# Patient Record
Sex: Male | Born: 1970 | Race: White | Hispanic: No | Marital: Single | Smoking: Never smoker
Health system: Southern US, Community
[De-identification: ages and names within clinical notes are randomized; demographics above are authoritative.]

## PROBLEM LIST (undated history)

## (undated) DIAGNOSIS — K469 Unspecified abdominal hernia without obstruction or gangrene: Secondary | ICD-10-CM

## (undated) DIAGNOSIS — M549 Dorsalgia, unspecified: Secondary | ICD-10-CM

## (undated) DIAGNOSIS — G8929 Other chronic pain: Secondary | ICD-10-CM

---

## 2000-01-24 ENCOUNTER — Encounter: Payer: Self-pay | Admitting: Emergency Medicine

## 2000-01-24 ENCOUNTER — Encounter: Payer: Self-pay | Admitting: General Surgery

## 2000-01-24 ENCOUNTER — Emergency Department (HOSPITAL_COMMUNITY): Admission: EM | Admit: 2000-01-24 | Discharge: 2000-01-24 | Payer: Self-pay | Admitting: Emergency Medicine

## 2000-01-29 ENCOUNTER — Emergency Department (HOSPITAL_COMMUNITY): Admission: EM | Admit: 2000-01-29 | Discharge: 2000-01-29 | Payer: Self-pay | Admitting: Emergency Medicine

## 2000-08-04 ENCOUNTER — Emergency Department (HOSPITAL_COMMUNITY): Admission: EM | Admit: 2000-08-04 | Discharge: 2000-08-04 | Payer: Self-pay | Admitting: Emergency Medicine

## 2001-04-04 ENCOUNTER — Emergency Department (HOSPITAL_COMMUNITY): Admission: EM | Admit: 2001-04-04 | Discharge: 2001-04-04 | Payer: Self-pay | Admitting: Emergency Medicine

## 2001-04-04 ENCOUNTER — Encounter: Payer: Self-pay | Admitting: Emergency Medicine

## 2001-04-21 ENCOUNTER — Ambulatory Visit (HOSPITAL_BASED_OUTPATIENT_CLINIC_OR_DEPARTMENT_OTHER): Admission: RE | Admit: 2001-04-21 | Discharge: 2001-04-21 | Payer: Self-pay | Admitting: Orthopedic Surgery

## 2002-02-05 ENCOUNTER — Emergency Department (HOSPITAL_COMMUNITY): Admission: EM | Admit: 2002-02-05 | Discharge: 2002-02-05 | Payer: Self-pay | Admitting: Emergency Medicine

## 2002-05-22 ENCOUNTER — Emergency Department (HOSPITAL_COMMUNITY): Admission: EM | Admit: 2002-05-22 | Discharge: 2002-05-23 | Payer: Self-pay | Admitting: Emergency Medicine

## 2002-12-29 ENCOUNTER — Emergency Department (HOSPITAL_COMMUNITY): Admission: EM | Admit: 2002-12-29 | Discharge: 2002-12-29 | Payer: Self-pay | Admitting: Emergency Medicine

## 2002-12-29 ENCOUNTER — Encounter: Payer: Self-pay | Admitting: Emergency Medicine

## 2003-01-17 ENCOUNTER — Encounter: Payer: Self-pay | Admitting: Emergency Medicine

## 2003-01-17 ENCOUNTER — Emergency Department (HOSPITAL_COMMUNITY): Admission: EM | Admit: 2003-01-17 | Discharge: 2003-01-17 | Payer: Self-pay | Admitting: Emergency Medicine

## 2003-02-02 ENCOUNTER — Ambulatory Visit (HOSPITAL_BASED_OUTPATIENT_CLINIC_OR_DEPARTMENT_OTHER): Admission: RE | Admit: 2003-02-02 | Discharge: 2003-02-02 | Payer: Self-pay | Admitting: Surgery

## 2003-08-24 ENCOUNTER — Emergency Department (HOSPITAL_COMMUNITY): Admission: EM | Admit: 2003-08-24 | Discharge: 2003-08-24 | Payer: Self-pay | Admitting: Emergency Medicine

## 2003-09-28 ENCOUNTER — Emergency Department (HOSPITAL_COMMUNITY): Admission: EM | Admit: 2003-09-28 | Discharge: 2003-09-28 | Payer: Self-pay | Admitting: Family Medicine

## 2004-09-26 ENCOUNTER — Emergency Department (HOSPITAL_COMMUNITY): Admission: EM | Admit: 2004-09-26 | Discharge: 2004-09-26 | Payer: Self-pay | Admitting: Emergency Medicine

## 2005-02-18 ENCOUNTER — Emergency Department (HOSPITAL_COMMUNITY): Admission: EM | Admit: 2005-02-18 | Discharge: 2005-02-19 | Payer: Self-pay | Admitting: Emergency Medicine

## 2007-05-10 ENCOUNTER — Emergency Department (HOSPITAL_COMMUNITY): Admission: EM | Admit: 2007-05-10 | Discharge: 2007-05-10 | Payer: Self-pay | Admitting: Emergency Medicine

## 2007-05-14 ENCOUNTER — Encounter: Admission: RE | Admit: 2007-05-14 | Discharge: 2007-07-07 | Payer: Self-pay | Admitting: Specialist

## 2007-06-09 ENCOUNTER — Ambulatory Visit (HOSPITAL_COMMUNITY): Admission: RE | Admit: 2007-06-09 | Discharge: 2007-06-09 | Payer: Self-pay | Admitting: Specialist

## 2007-07-12 ENCOUNTER — Encounter: Admission: RE | Admit: 2007-07-12 | Discharge: 2007-07-12 | Payer: Self-pay | Admitting: Orthopedic Surgery

## 2007-08-09 ENCOUNTER — Encounter: Admission: RE | Admit: 2007-08-09 | Discharge: 2007-08-09 | Payer: Self-pay | Admitting: Orthopedic Surgery

## 2007-08-13 ENCOUNTER — Encounter: Admission: RE | Admit: 2007-08-13 | Discharge: 2007-11-11 | Payer: Self-pay | Admitting: Orthopedic Surgery

## 2007-09-27 ENCOUNTER — Ambulatory Visit (HOSPITAL_COMMUNITY): Admission: RE | Admit: 2007-09-27 | Discharge: 2007-09-27 | Payer: Self-pay | Admitting: Orthopedic Surgery

## 2007-12-10 ENCOUNTER — Ambulatory Visit: Payer: Self-pay | Admitting: Physical Medicine & Rehabilitation

## 2008-02-18 ENCOUNTER — Ambulatory Visit: Payer: Self-pay | Admitting: Physical Medicine & Rehabilitation

## 2008-06-09 ENCOUNTER — Ambulatory Visit: Payer: Self-pay | Admitting: Physical Medicine & Rehabilitation

## 2009-12-20 ENCOUNTER — Ambulatory Visit: Payer: Self-pay | Admitting: Internal Medicine

## 2009-12-20 DIAGNOSIS — K219 Gastro-esophageal reflux disease without esophagitis: Secondary | ICD-10-CM

## 2009-12-20 DIAGNOSIS — R519 Headache, unspecified: Secondary | ICD-10-CM | POA: Insufficient documentation

## 2009-12-20 DIAGNOSIS — R51 Headache: Secondary | ICD-10-CM

## 2009-12-20 DIAGNOSIS — F411 Generalized anxiety disorder: Secondary | ICD-10-CM | POA: Insufficient documentation

## 2010-06-25 ENCOUNTER — Emergency Department (HOSPITAL_COMMUNITY): Admission: EM | Admit: 2010-06-25 | Discharge: 2010-06-25 | Payer: Self-pay | Admitting: Family Medicine
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
## 2010-10-08 ENCOUNTER — Ambulatory Visit (INDEPENDENT_AMBULATORY_CARE_PROVIDER_SITE_OTHER): Payer: Medicare Other

## 2010-10-08 ENCOUNTER — Inpatient Hospital Stay (INDEPENDENT_AMBULATORY_CARE_PROVIDER_SITE_OTHER)
Admission: RE | Admit: 2010-10-08 | Discharge: 2010-10-08 | Disposition: A | Discharge status: Home / Self Care | Payer: Medicare Other | Source: Ambulatory Visit

## 2010-10-08 DIAGNOSIS — M25519 Pain in unspecified shoulder: Secondary | ICD-10-CM

## 2010-10-08 DIAGNOSIS — H6690 Otitis media, unspecified, unspecified ear: Secondary | ICD-10-CM

## 2010-10-08 DIAGNOSIS — J019 Acute sinusitis, unspecified: Secondary | ICD-10-CM

## 2010-10-08 DIAGNOSIS — R071 Chest pain on breathing: Secondary | ICD-10-CM
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
## 2010-12-24 NOTE — Procedures (Signed)
NAME:  Eduardo Johnson, Eduardo Johnson NO.:  [id number]   MEDICAL RECORD NO.:  [id number]           PATIENT TYPE:   LOCATION:                                 FACILITY:   PHYSICIAN:  Erick Colace, M.D.DATE OF BIRTH:  06/03/1971   DATE OF PROCEDURE:  12/10/2007  DATE OF DISCHARGE:                               OPERATIVE REPORT   INDICATION:  Myofascial pain syndrome thoracolumbar area.   Area is marked and prepped with Betadine 3 cm lateral spinous processes  of bilateral L1 and L3 and left T9.  Betadine prep with alcohol prep  followed by insertion of a 27 gauge 5/8 inch needle, 1 mL of 1%  lidocaine into each spot after negative drawback for blood.  The patient  tolerated the procedure well.  Post injection instructions given.      Erick Colace, M.D.  Electronically Signed     AEK/MEDQ  D:  12/10/2007 10:37:34  T:  12/10/2007 10:46:32  Job:  [id number]   cc:   [name], MD  Fax: [phone]

## 2010-12-27 NOTE — Op Note (Signed)
NAME:  Eduardo Johnson, Eduardo Johnson                             ACCOUNT NO.:  [id number]   MEDICAL RECORD NO.:  [id number]                   PATIENT TYPE:  AMB   LOCATION:  [facility]                                  FACILITY:  [facility]   PHYSICIAN:  Sandria Bales. Ezzard Standing, M.D.               DATE OF BIRTH:  May 01, 1971   DATE OF PROCEDURE:  02/02/2003  DATE OF DISCHARGE:                                 OPERATIVE REPORT   PREOPERATIVE DIAGNOSIS:  Left inguinal hernia.   POSTOPERATIVE DIAGNOSIS:  Large indirect left inguinal hernia.   PROCEDURE:  Open repair of left inguinal hernia with mesh.   SURGEON:  Sandria Bales. Ezzard Standing, M.D.   ANESTHESIA:  LMA with approximately 25 mL 0.25% Marcaine plain.   COMPLICATIONS:  None.   INDICATION FOR PROCEDURE:  [name] is a 40 year old white male who has a  symptomatic left inguinal hernia and now comes for hernia repair.  I  discussed with him the options and indications for surgery.  I also  discussed the complications, including but not limited to bleeding,  infection, nerve injury, and recurrent hernia.   The patient was taken to the operating room, where he was given 400 mg of  Cipro as an IV antibiotic. His lower abdomen was shaved, prepped with  Betadine solution, and sterilely draped.  He underwent an LMA anesthesia  supervised by [name]. [name], M.D.  A left inguinal area incision was  made with sharp dissection and carried down to the external oblique fascia.  The external oblique fascia was opened and the cord structures identified  and encircled with a Penrose drain.  I used 0.25% Marcaine to infiltrate the  skin, subcutaneous fat, subfascial spaces.  I used a total of about 25 mL  during the procedure.  Along the anterior medial surface of the cord  structures was a large indirect inguinal hernia, which went down to the  testicle.  I opened this sac, dissected the sac back to the internal ring,  and ligated the sac with a 0 chromic suture.  The distal sac I  just left  intact, though I did fillet it some.   I then used a piece of Atrium mesh, which I cut in the shape of the inguinal  floor, and I sewed the Atrium mesh medial to the deep pubic tubercle,  inferiorly to the shelving edge of the inguinal ligament, superiorly to the  transversalis fascia.  I cut a keyhole for the cord structures to go through  and to recreate an internal ring and sewed the mesh behind the keyhole.  The  mesh lay flat, covered the inguinal floor, and recreated the internal ring.  The cord structures were then returned to their normal location.  The wound  was then irrigated.  I used 0 Novofil as the suture in an interrupted  fashion to place the mesh.  I then  closed the external oblique fascia with  interrupted 3-0 Vicryl sutures.  I closed the subcutaneous tissues with  interrupted 3-0 Vicryl sutures, irrigated the wound again, closed the skin  with a 5-0 Monocryl suture, and painted the wound with tincture of Benzoin  and steri-stripped it.   The patient tolerated the procedure well, was transported to the recovery  room in good condition.  He will be discharged home today and see me back in  two weeks.  Call for interval problems.  He will be given Vicodin for pain  at home.                                               Sandria Bales. Ezzard Standing, M.D.    DHN/MEDQ  D:  02/02/2003  T:  02/03/2003  Job:  [id number]

## 2011-10-30 ENCOUNTER — Encounter (HOSPITAL_COMMUNITY): Payer: Self-pay | Admitting: *Deleted

## 2011-10-30 ENCOUNTER — Emergency Department (HOSPITAL_COMMUNITY)
Admission: EM | Admit: 2011-10-30 | Discharge: 2011-10-31 | Disposition: A | Discharge status: Home / Self Care | Payer: Medicare Other | Attending: Emergency Medicine | Admitting: Emergency Medicine

## 2011-10-30 DIAGNOSIS — M549 Dorsalgia, unspecified: Secondary | ICD-10-CM | POA: Insufficient documentation

## 2011-10-30 DIAGNOSIS — K5732 Diverticulitis of large intestine without perforation or abscess without bleeding: Secondary | ICD-10-CM | POA: Insufficient documentation

## 2011-10-30 DIAGNOSIS — K5792 Diverticulitis of intestine, part unspecified, without perforation or abscess without bleeding: Secondary | ICD-10-CM

## 2011-10-30 DIAGNOSIS — G8929 Other chronic pain: Secondary | ICD-10-CM | POA: Insufficient documentation

## 2011-10-30 HISTORY — DX: Dorsalgia, unspecified: M54.9

## 2011-10-30 HISTORY — DX: Other chronic pain: G89.29

## 2011-10-30 HISTORY — DX: Unspecified abdominal hernia without obstruction or gangrene: K46.9

## 2011-10-30 NOTE — ED Notes (Signed)
Pt states that "he wasn't doing nothing" yesterday when the left side of his abdomen began to hurt.  Pt states that the pain originates in his left groin area and radiates upwards.  Pt states that he has a hx of hernia here.  Pt reports bowel movements per his norm. Pt denies N/V/D.

## 2011-10-31 ENCOUNTER — Emergency Department (HOSPITAL_COMMUNITY): Payer: Medicare Other

## 2011-10-31 LAB — URINALYSIS, ROUTINE W REFLEX MICROSCOPIC
Glucose, UA: NEGATIVE mg/dL
Hgb urine dipstick: NEGATIVE
Nitrite: NEGATIVE
Protein, ur: NEGATIVE mg/dL
Urobilinogen, UA: 0.2 mg/dL (ref 0.0–1.0)
pH: 5.5 (ref 5.0–8.0)

## 2011-10-31 LAB — DIFFERENTIAL
Basophils Absolute: 0.1 10*3/uL (ref 0.0–0.1)
Basophils Relative: 1 % (ref 0–1)
Eosinophils Absolute: 0.2 10*3/uL (ref 0.0–0.7)
Eosinophils Relative: 1 % (ref 0–5)
Lymphocytes Relative: 21 % (ref 12–46)
Lymphs Abs: 2.8 10*3/uL (ref 0.7–4.0)
Monocytes Relative: 10 % (ref 3–12)
Neutro Abs: 8.7 10*3/uL — ABNORMAL HIGH (ref 1.7–7.7)

## 2011-10-31 LAB — LIPASE, BLOOD: Lipase: 36 U/L (ref 11–59)

## 2011-10-31 LAB — CBC
MCH: 29.3 pg (ref 26.0–34.0)
RBC: 4.88 MIL/uL (ref 4.22–5.81)
RDW: 13 % (ref 11.5–15.5)
WBC: 12.9 10*3/uL — ABNORMAL HIGH (ref 4.0–10.5)

## 2011-10-31 LAB — COMPREHENSIVE METABOLIC PANEL
BUN: 12 mg/dL (ref 6–23)
Creatinine, Ser: 0.95 mg/dL (ref 0.50–1.35)
GFR calc Af Amer: >90 mL/min (ref >90)
GFR calc non Af Amer: >90 mL/min (ref >90)
Potassium: 3.4 mEq/L — ABNORMAL LOW (ref 3.5–5.1)
Total Bilirubin: 0.5 mg/dL (ref 0.3–1.2)

## 2011-10-31 MED ORDER — OXYCODONE-ACETAMINOPHEN 5-325 MG PO TABS: 1.0000 | ORAL_TABLET | Freq: Four times a day (QID) | ORAL | Status: AC | PRN

## 2011-10-31 MED ORDER — ONDANSETRON HCL 4 MG/2ML IJ SOLN
4.0000 mg | Freq: Once | INTRAMUSCULAR | Status: AC
  Administered 2011-10-31: 4 mg via INTRAVENOUS
  Filled 2011-10-31: qty 2

## 2011-10-31 MED ORDER — CIPROFLOXACIN IN D5W 400 MG/200ML IV SOLN
400.0000 mg | Freq: Once | INTRAVENOUS | Status: AC
  Administered 2011-10-31: 400 mg via INTRAVENOUS
  Filled 2011-10-31: qty 200

## 2011-10-31 MED ORDER — METRONIDAZOLE IN NACL 5-0.79 MG/ML-% IV SOLN
500.0000 mg | Freq: Once | INTRAVENOUS | Status: AC
  Administered 2011-10-31: 500 mg via INTRAVENOUS
  Filled 2011-10-31: qty 100

## 2011-10-31 MED ORDER — METRONIDAZOLE 500 MG PO TABS: 500.0000 mg | ORAL_TABLET | Freq: Two times a day (BID) | ORAL | Status: AC

## 2011-10-31 MED ORDER — IOHEXOL 300 MG/ML  SOLN
100.0000 mL | Freq: Once | INTRAMUSCULAR | Status: AC | PRN
  Administered 2011-10-31: 100 mL via INTRAVENOUS

## 2011-10-31 MED ORDER — CIPROFLOXACIN HCL 500 MG PO TABS: 500.0000 mg | ORAL_TABLET | Freq: Two times a day (BID) | ORAL | Status: AC

## 2011-10-31 MED ORDER — HYDROMORPHONE HCL PF 1 MG/ML IJ SOLN
1.0000 mg | Freq: Once | INTRAMUSCULAR | Status: AC
  Administered 2011-10-31: 1 mg via INTRAVENOUS
  Filled 2011-10-31: qty 1

## 2011-10-31 MED ORDER — SODIUM CHLORIDE 0.9 % IV BOLUS (SEPSIS)
1000.0000 mL | Freq: Once | INTRAVENOUS | Status: AC
  Administered 2011-10-31: 1000 mL via INTRAVENOUS

## 2011-10-31 MED ORDER — ONDANSETRON HCL 4 MG PO TABS
4.0000 mg | ORAL_TABLET | Freq: Four times a day (QID) | ORAL | Status: AC
Start: 2011-10-31 — End: 2011-11-07

## 2011-10-31 NOTE — ED Notes (Signed)
Patient c/o progressive left lower ABD pain radiating to the left groin since yesterday; reports worsening with BM. Denies n/v/d/dysuria. Patient reports hx of left inguinal hernia.

## 2011-10-31 NOTE — ED Notes (Signed)
MD at bedside.  Dr. King at bedside 
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
## 2011-10-31 NOTE — Discharge Instructions (Signed)
Diverticulitis A diverticulum is a small pouch or sac on the colon. Diverticulosis is the presence of these diverticula on the colon. Diverticulitis is the irritation (inflammation) or infection of diverticula. CAUSES  The colon and its diverticula contain bacteria. If food particles block the tiny opening to a diverticulum, the bacteria inside can grow and cause an increase in pressure. This leads to infection and inflammation and is called diverticulitis. SYMPTOMS   Abdominal pain and tenderness. Usually, the pain is located on the left side of your abdomen. However, it could be located elsewhere.   Fever.   Bloating.   Feeling sick to your stomach (nausea).   Throwing up (vomiting).   Abnormal stools.  DIAGNOSIS  Your caregiver will take a history and perform a physical exam. Since many things can cause abdominal pain, other tests may be necessary. Tests may include:  Blood tests.   Urine tests.   X-ray of the abdomen.   CT scan of the abdomen.  Sometimes, surgery is needed to determine if diverticulitis or other conditions are causing your symptoms. TREATMENT  Most of the time, you can be treated without surgery. Treatment includes:  Resting the bowels by only having liquids for a few days. As you improve, you will need to eat a low-fiber diet.   Intravenous (IV) fluids if you are losing body fluids (dehydrated).   Antibiotic medicines that treat infections may be given.   Pain and nausea medicine, if needed.   Surgery if the inflamed diverticulum has burst.  HOME CARE INSTRUCTIONS   Try a clear liquid diet (broth, tea, or water for as long as directed by your caregiver). You may then gradually begin a low-fiber diet as tolerated. A low-fiber diet is a diet with less than 10 grams of fiber. Choose the foods below to reduce fiber in the diet:   White breads, cereals, rice, and pasta.   Cooked fruits and vegetables or soft fresh fruits and vegetables without the skin.     Ground or well-cooked tender beef, ham, veal, lamb, pork, or poultry.   Eggs and seafood.   After your diverticulitis symptoms have improved, your caregiver may put you on a high-fiber diet. A high-fiber diet includes 14 grams of fiber for every 1000 calories consumed. For a standard 2000 calorie diet, you would need 28 grams of fiber. Follow these diet guidelines to help you increase the fiber in your diet. It is important to slowly increase the amount fiber in your diet to avoid gas, constipation, and bloating.   Choose whole-grain breads, cereals, pasta, and brown rice.   Choose fresh fruits and vegetables with the skin on. Do not overcook vegetables because the more vegetables are cooked, the more fiber is lost.   Choose more nuts, seeds, legumes, dried peas, beans, and lentils.   Look for food products that have greater than 3 grams of fiber per serving on the Nutrition Facts label.   Take all medicine as directed by your caregiver.   If your caregiver has given you a follow-up appointment, it is very important that you go. Not going could result in lasting (chronic) or permanent injury, pain, and disability. If there is any problem keeping the appointment, call to reschedule.  SEEK MEDICAL CARE IF:   Your pain does not improve.   You have a hard time advancing your diet beyond clear liquids.   Your bowel movements do not return to normal.  SEEK IMMEDIATE MEDICAL CARE IF:   Your pain becomes   worse.   You have an oral temperature above 102 F (38.9 C), not controlled by medicine.   You have repeated vomiting.   You have bloody or black, tarry stools.   Symptoms that brought you to your caregiver become worse or are not getting better.  MAKE SURE YOU:   Understand these instructions.   Will watch your condition.   Will get help right away if you are not doing well or get worse.  Document Released: 05/07/2005 Document Revised: 07/17/2011 Document Reviewed:  09/02/2010 [employer] Patient Information 2012 [employer]. 

## 2012-01-15 ENCOUNTER — Emergency Department (HOSPITAL_COMMUNITY)
Admission: EM | Admit: 2012-01-15 | Discharge: 2012-01-15 | Disposition: A | Discharge status: Home / Self Care | Payer: Medicare Other | Attending: Emergency Medicine | Admitting: Emergency Medicine

## 2012-01-15 ENCOUNTER — Encounter (HOSPITAL_COMMUNITY): Payer: Self-pay | Admitting: Emergency Medicine

## 2012-01-15 DIAGNOSIS — L02419 Cutaneous abscess of limb, unspecified: Secondary | ICD-10-CM | POA: Insufficient documentation

## 2012-01-15 DIAGNOSIS — L03115 Cellulitis of right lower limb: Secondary | ICD-10-CM

## 2012-01-15 MED ORDER — CLINDAMYCIN HCL 150 MG PO CAPS: 150.0000 mg | ORAL_CAPSULE | Freq: Four times a day (QID) | ORAL | Status: AC
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
## 2012-01-15 NOTE — Discharge Instructions (Signed)
You may continue apply neosporin to rash.  Take antibiotic as prescribed.  Return if you notice increase redness, pain, having fever or if you have any other concerns.  You should see improvement within 2 days.   Cellulitis Cellulitis is an infection of the skin and the tissue beneath it. The area is typically red and tender. It is caused by germs (bacteria) (usually staph or strep) that enter the body through cuts or sores. Cellulitis most commonly occurs in the arms or lower legs.  HOME CARE INSTRUCTIONS   If you are given a prescription for medications which kill germs (antibiotics), take as directed until finished.   If the infection is on the arm or leg, keep the limb elevated as able.   Use a warm cloth several times per day to relieve pain and encourage healing.   See your caregiver for recheck of the infected site as directed if problems arise.   Only take over-the-counter or prescription medicines for pain, discomfort, or fever as directed by your caregiver.  SEEK MEDICAL CARE IF:   The area of redness (inflammation) is spreading, there are red streaks coming from the infected site, or if a part of the infection begins to turn dark in color.   The joint or bone underneath the infected skin becomes painful after the skin has healed.   The infection returns in the same or another area after it seems to have gone away.   A boil or bump swells up. This may be an abscess.   New, unexplained problems such as pain or fever develop.  SEEK IMMEDIATE MEDICAL CARE IF:   You have a fever.   You or your child feels drowsy or lethargic.   There is vomiting, diarrhea, or lasting discomfort or feeling ill (malaise) with muscle aches and pains.  MAKE SURE YOU:   Understand these instructions.   Will watch your condition.   Will get help right away if you are not doing well or get worse.  Document Released: 05/07/2005 Document Revised: 07/17/2011 Document Reviewed:  03/15/2008 [facility] [place] Patient Information 2012 [place], [place].

## 2012-01-15 NOTE — ED Notes (Signed)
Pt states that a couple of days ago he noticed a red spot on the back of leg. States that he thinks that maybe its a spider bite. C/o pain, redness to area. States that he has used neosporin to apply to the area with no relief.

## 2012-01-16 NOTE — ED Provider Notes (Signed)
Medical screening examination/treatment/procedure(s) were performed by non-physician practitioner and as supervising physician I was immediately available for consultation/collaboration.  Zilla Shartzer, MD 01/16/12 1522 

## 2012-06-25 ENCOUNTER — Encounter (HOSPITAL_COMMUNITY): Payer: Self-pay

## 2012-06-25 DIAGNOSIS — Y939 Activity, unspecified: Secondary | ICD-10-CM | POA: Insufficient documentation

## 2012-06-25 DIAGNOSIS — Z8719 Personal history of other diseases of the digestive system: Secondary | ICD-10-CM | POA: Insufficient documentation

## 2012-06-25 DIAGNOSIS — L02419 Cutaneous abscess of limb, unspecified: Secondary | ICD-10-CM | POA: Insufficient documentation

## 2012-06-25 DIAGNOSIS — Y929 Unspecified place or not applicable: Secondary | ICD-10-CM | POA: Insufficient documentation

## 2012-06-25 DIAGNOSIS — G8929 Other chronic pain: Secondary | ICD-10-CM | POA: Insufficient documentation

## 2012-06-25 DIAGNOSIS — Z79899 Other long term (current) drug therapy: Secondary | ICD-10-CM | POA: Insufficient documentation

## 2012-06-25 DIAGNOSIS — M549 Dorsalgia, unspecified: Secondary | ICD-10-CM | POA: Insufficient documentation

## 2012-06-25 NOTE — ED Notes (Signed)
Pt complains of bite to left lower leg, swelling noted, sts noticed this am

## 2012-06-26 ENCOUNTER — Emergency Department (HOSPITAL_COMMUNITY)
Admission: EM | Admit: 2012-06-26 | Discharge: 2012-06-26 | Disposition: A | Discharge status: Home / Self Care | Payer: Medicare Other | Attending: Emergency Medicine | Admitting: Emergency Medicine

## 2012-06-26 DIAGNOSIS — L0390 Cellulitis, unspecified: Secondary | ICD-10-CM

## 2012-06-26 MED ORDER — DOXYCYCLINE HYCLATE 100 MG PO CAPS: 100.0000 mg | ORAL_CAPSULE | Freq: Two times a day (BID) | ORAL | Status: DC
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
## 2013-01-09 ENCOUNTER — Emergency Department (HOSPITAL_COMMUNITY)
Admission: EM | Admit: 2013-01-09 | Discharge: 2013-01-09 | Disposition: A | Discharge status: Home / Self Care | Payer: Medicare Other | Attending: Emergency Medicine | Admitting: Emergency Medicine

## 2013-01-09 ENCOUNTER — Encounter (HOSPITAL_COMMUNITY): Payer: Self-pay | Admitting: Emergency Medicine

## 2013-01-09 DIAGNOSIS — G8929 Other chronic pain: Secondary | ICD-10-CM | POA: Insufficient documentation

## 2013-01-09 DIAGNOSIS — Z8719 Personal history of other diseases of the digestive system: Secondary | ICD-10-CM | POA: Insufficient documentation

## 2013-01-09 DIAGNOSIS — M549 Dorsalgia, unspecified: Secondary | ICD-10-CM

## 2013-01-09 MED ORDER — HYDROMORPHONE HCL PF 1 MG/ML IJ SOLN
1.0000 mg | Freq: Once | INTRAMUSCULAR | Status: AC
  Administered 2013-01-09: 1 mg via INTRAMUSCULAR
  Filled 2013-01-09: qty 1

## 2013-01-09 MED ORDER — HYDROCODONE-ACETAMINOPHEN 5-325 MG PO TABS: 1.0000 | ORAL_TABLET | ORAL | Status: DC | PRN

## 2013-01-09 MED ORDER — CYCLOBENZAPRINE HCL 10 MG PO TABS
5.0000 mg | ORAL_TABLET | Freq: Once | ORAL | Status: AC
  Administered 2013-01-09: 5 mg via ORAL
  Filled 2013-01-09: qty 1
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
## 2013-05-05 ENCOUNTER — Ambulatory Visit (INDEPENDENT_AMBULATORY_CARE_PROVIDER_SITE_OTHER): Payer: Medicare Other | Admitting: Emergency Medicine

## 2013-05-05 ENCOUNTER — Encounter: Payer: Self-pay | Admitting: Emergency Medicine

## 2013-05-05 VITALS — BP 140/90 | HR 72 | Resp 16 | Ht 68.0 in | Wt 198.0 lb

## 2013-05-05 DIAGNOSIS — K219 Gastro-esophageal reflux disease without esophagitis: Secondary | ICD-10-CM

## 2013-05-05 DIAGNOSIS — R079 Chest pain, unspecified: Secondary | ICD-10-CM

## 2013-05-05 MED ORDER — SUCRALFATE 1 G PO TABS: ORAL_TABLET | ORAL | Status: DC

## 2013-05-05 MED ORDER — LANSOPRAZOLE 30 MG PO CPDR: 30.0000 mg | DELAYED_RELEASE_CAPSULE | Freq: Every day | ORAL | Status: DC

## 2013-05-05 NOTE — Progress Notes (Deleted)
Pt took 30 cc's of GI cocktail at 5:45pm. Cassie Freer
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
## 2013-05-24 ENCOUNTER — Ambulatory Visit: Payer: Medicare Other | Admitting: Cardiovascular Disease

## 2013-05-25 ENCOUNTER — Encounter: Payer: Self-pay | Admitting: Cardiovascular Disease

## 2013-05-27 ENCOUNTER — Encounter (HOSPITAL_COMMUNITY): Payer: Self-pay | Admitting: Emergency Medicine

## 2013-05-27 ENCOUNTER — Emergency Department (HOSPITAL_COMMUNITY)
Admission: EM | Admit: 2013-05-27 | Discharge: 2013-05-27 | Disposition: A | Discharge status: Home / Self Care | Payer: Medicare Other | Attending: Emergency Medicine | Admitting: Emergency Medicine

## 2013-05-27 DIAGNOSIS — Z79899 Other long term (current) drug therapy: Secondary | ICD-10-CM | POA: Insufficient documentation

## 2013-05-27 DIAGNOSIS — M545 Low back pain, unspecified: Secondary | ICD-10-CM | POA: Insufficient documentation

## 2013-05-27 DIAGNOSIS — R52 Pain, unspecified: Secondary | ICD-10-CM | POA: Insufficient documentation

## 2013-05-27 DIAGNOSIS — Z7982 Long term (current) use of aspirin: Secondary | ICD-10-CM | POA: Insufficient documentation

## 2013-05-27 DIAGNOSIS — G8929 Other chronic pain: Secondary | ICD-10-CM | POA: Insufficient documentation

## 2013-05-27 DIAGNOSIS — R209 Unspecified disturbances of skin sensation: Secondary | ICD-10-CM | POA: Insufficient documentation

## 2013-05-27 DIAGNOSIS — Z880 Allergy status to penicillin: Secondary | ICD-10-CM | POA: Insufficient documentation

## 2013-05-27 DIAGNOSIS — M79609 Pain in unspecified limb: Secondary | ICD-10-CM | POA: Insufficient documentation

## 2013-05-27 DIAGNOSIS — Z8719 Personal history of other diseases of the digestive system: Secondary | ICD-10-CM | POA: Insufficient documentation

## 2013-05-27 MED ORDER — NAPROXEN 500 MG PO TABS: 500.0000 mg | ORAL_TABLET | Freq: Two times a day (BID) | ORAL | Status: DC

## 2013-05-27 MED ORDER — CYCLOBENZAPRINE HCL 10 MG PO TABS: 10.0000 mg | ORAL_TABLET | Freq: Two times a day (BID) | ORAL | Status: DC | PRN

## 2013-05-27 MED ORDER — HYDROCODONE-ACETAMINOPHEN 5-325 MG PO TABS: 1.0000 | ORAL_TABLET | Freq: Three times a day (TID) | ORAL | Status: DC | PRN

## 2013-05-27 MED ORDER — NAPROXEN 500 MG PO TABS
500.0000 mg | ORAL_TABLET | Freq: Once | ORAL | Status: AC
  Administered 2013-05-27: 500 mg via ORAL
  Filled 2013-05-27: qty 1

## 2013-05-27 MED ORDER — KETOROLAC TROMETHAMINE 60 MG/2ML IM SOLN
60.0000 mg | Freq: Once | INTRAMUSCULAR | Status: AC
  Administered 2013-05-27: 60 mg via INTRAMUSCULAR
  Filled 2013-05-27: qty 2
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
## 2013-05-28 NOTE — ED Provider Notes (Signed)
Medical screening examination/treatment/procedure(s) were performed by non-physician practitioner and as supervising physician I was immediately available for consultation/collaboration.  Doug Sou, MD 05/28/13 [phone]

## 2014-03-20 ENCOUNTER — Emergency Department (INDEPENDENT_AMBULATORY_CARE_PROVIDER_SITE_OTHER)
Admission: EM | Admit: 2014-03-20 | Discharge: 2014-03-20 | Disposition: A | Discharge status: Home / Self Care | Payer: Medicare Other | Source: Home / Self Care

## 2014-03-20 ENCOUNTER — Emergency Department (INDEPENDENT_AMBULATORY_CARE_PROVIDER_SITE_OTHER): Payer: Medicare Other

## 2014-03-20 ENCOUNTER — Encounter (HOSPITAL_COMMUNITY): Payer: Self-pay | Admitting: Emergency Medicine

## 2014-03-20 DIAGNOSIS — R143 Flatulence: Secondary | ICD-10-CM

## 2014-03-20 DIAGNOSIS — R142 Eructation: Secondary | ICD-10-CM

## 2014-03-20 DIAGNOSIS — R141 Gas pain: Secondary | ICD-10-CM

## 2014-03-20 DIAGNOSIS — K5909 Other constipation: Secondary | ICD-10-CM

## 2014-03-20 LAB — POCT URINALYSIS DIP (DEVICE)
Bilirubin Urine: NEGATIVE
GLUCOSE, UA: NEGATIVE mg/dL
Hgb urine dipstick: NEGATIVE
KETONES UR: NEGATIVE mg/dL
LEUKOCYTES UA: NEGATIVE
Nitrite: NEGATIVE
PROTEIN: NEGATIVE mg/dL
SPECIFIC GRAVITY, URINE: 1.025 (ref 1.005–1.030)
UROBILINOGEN UA: 0.2 mg/dL (ref 0.0–1.0)
pH: 5 (ref 5.0–8.0)

## 2014-03-20 MED ORDER — POLYETHYLENE GLYCOL 3350 17 GM/SCOOP PO POWD: 17.0000 g | Freq: Every day | ORAL | Status: AC
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
## 2014-03-20 NOTE — ED Notes (Signed)
C/o 2 day duration of pain lower left abdominal area w bloating. Ever since ate out . Minimal relief w OTC liquid antacids ; reportedly having normal BM

## 2014-03-20 NOTE — ED Provider Notes (Signed)
Medical screening examination/treatment/procedure(s) were performed by a resident physician or non-physician practitioner and as the supervising physician I was immediately available for consultation/collaboration.  Shelly Flattenavid Rodman Recupero, MD Family Medicine   Ozella Rocksavid J Manasi Dishon, MD 03/20/14 [phone]

## 2014-03-20 NOTE — Discharge Instructions (Signed)
Bloating Bloating is the feeling of fullness in your belly. You may feel as though your pants are too tight. Often the cause of bloating is overeating, retaining fluids, or having gas in your bowel. It is also caused by swallowing air and eating foods that cause gas. Irritable bowel syndrome is one of the most common causes of bloating. Constipation is also a common cause. Sometimes more serious problems can cause bloating. SYMPTOMS  Usually there is a feeling of fullness, as though your abdomen is bulged out. There may be mild discomfort.  DIAGNOSIS  Usually no particular testing is necessary for most bloating. If the condition persists and seems to become worse, your caregiver may do additional testing.  TREATMENT   There is no direct treatment for bloating.  Do not put gas into the bowel. Avoid chewing gum and sucking on candy. These tend to make you swallow air. Swallowing air can also be a nervous habit. Try to avoid this.  Avoiding high residue diets will help. Eat foods with soluble fibers (examples include root vegetables, apples, or barley) and substitute dairy products with soy and rice products. This helps irritable bowel syndrome.  If constipation is the cause, then a high residue diet with more fiber will help.  Avoid carbonated beverages.  Over-the-counter preparations are available that help reduce gas. Your pharmacist can help you with this. SEEK MEDICAL CARE IF:   Bloating continues and seems to be getting worse.  You notice a weight gain.  You have a weight loss but the bloating is getting worse.  You have changes in your bowel habits or develop nausea or vomiting. SEEK IMMEDIATE MEDICAL CARE IF:   You develop shortness of breath or swelling in your legs.  You have an increase in abdominal pain or develop chest pain. Document Released: 05/28/2006 Document Revised: 10/20/2011 Document Reviewed: 07/16/2007 [facility] Patient Information 2015 [place]. This  information is not intended to replace advice given to you by your health care provider. Make sure you discuss any questions you have with your health care provider.  Constipation Use miralax as directed. Lots of water, fluids and increase fiber in your diet. Constipation is when a person:  Poops (has a bowel movement) less than 3 times a week.  Has a hard time pooping.  Has poop that is dry, hard, or bigger than normal. HOME CARE   Eat foods with a lot of fiber in them. This includes fruits, vegetables, beans, and whole grains such as brown rice.  Avoid fatty foods and foods with a lot of sugar. This includes french fries, hamburgers, cookies, candy, and soda.  If you are not getting enough fiber from food, take products with added fiber in them (supplements).  Drink enough fluid to keep your pee (urine) clear or pale yellow.  Exercise on a regular basis, or as told by your doctor.  Go to the restroom when you feel like you need to poop. Do not hold it.  Only take medicine as told by your doctor. Do not take medicines that help you poop (laxatives) without talking to your doctor first. GET HELP RIGHT AWAY IF:   You have bright red blood in your poop (stool).  Your constipation lasts more than 4 days or gets worse.  You have belly (abdominal) or butt (rectal) pain.  You have thin poop (as thin as a pencil).  You lose weight, and it cannot be explained. MAKE SURE YOU:   Understand these instructions.  Will watch your  condition.  Will get help right away if you are not doing well or get worse. Document Released: 01/14/2008 Document Revised: 08/02/2013 Document Reviewed: 05/09/2013 [facility] Patient Information 2015 [place], [place]. This information is not intended to replace advice given to you by your health care provider. Make sure you discuss any questions you have with your health care provider.

## 2014-09-16 IMAGING — CR DG ABDOMEN 1V
2 series · 2 of 2 positions shown · non-contrast
Comparison: CT 10/31/2011 and plain films 08/09/2007

CLINICAL DATA: Bloating and abdominal pain 3 days.

EXAM:
ABDOMEN - 1 VIEW

[view not recorded (1 of 2)]
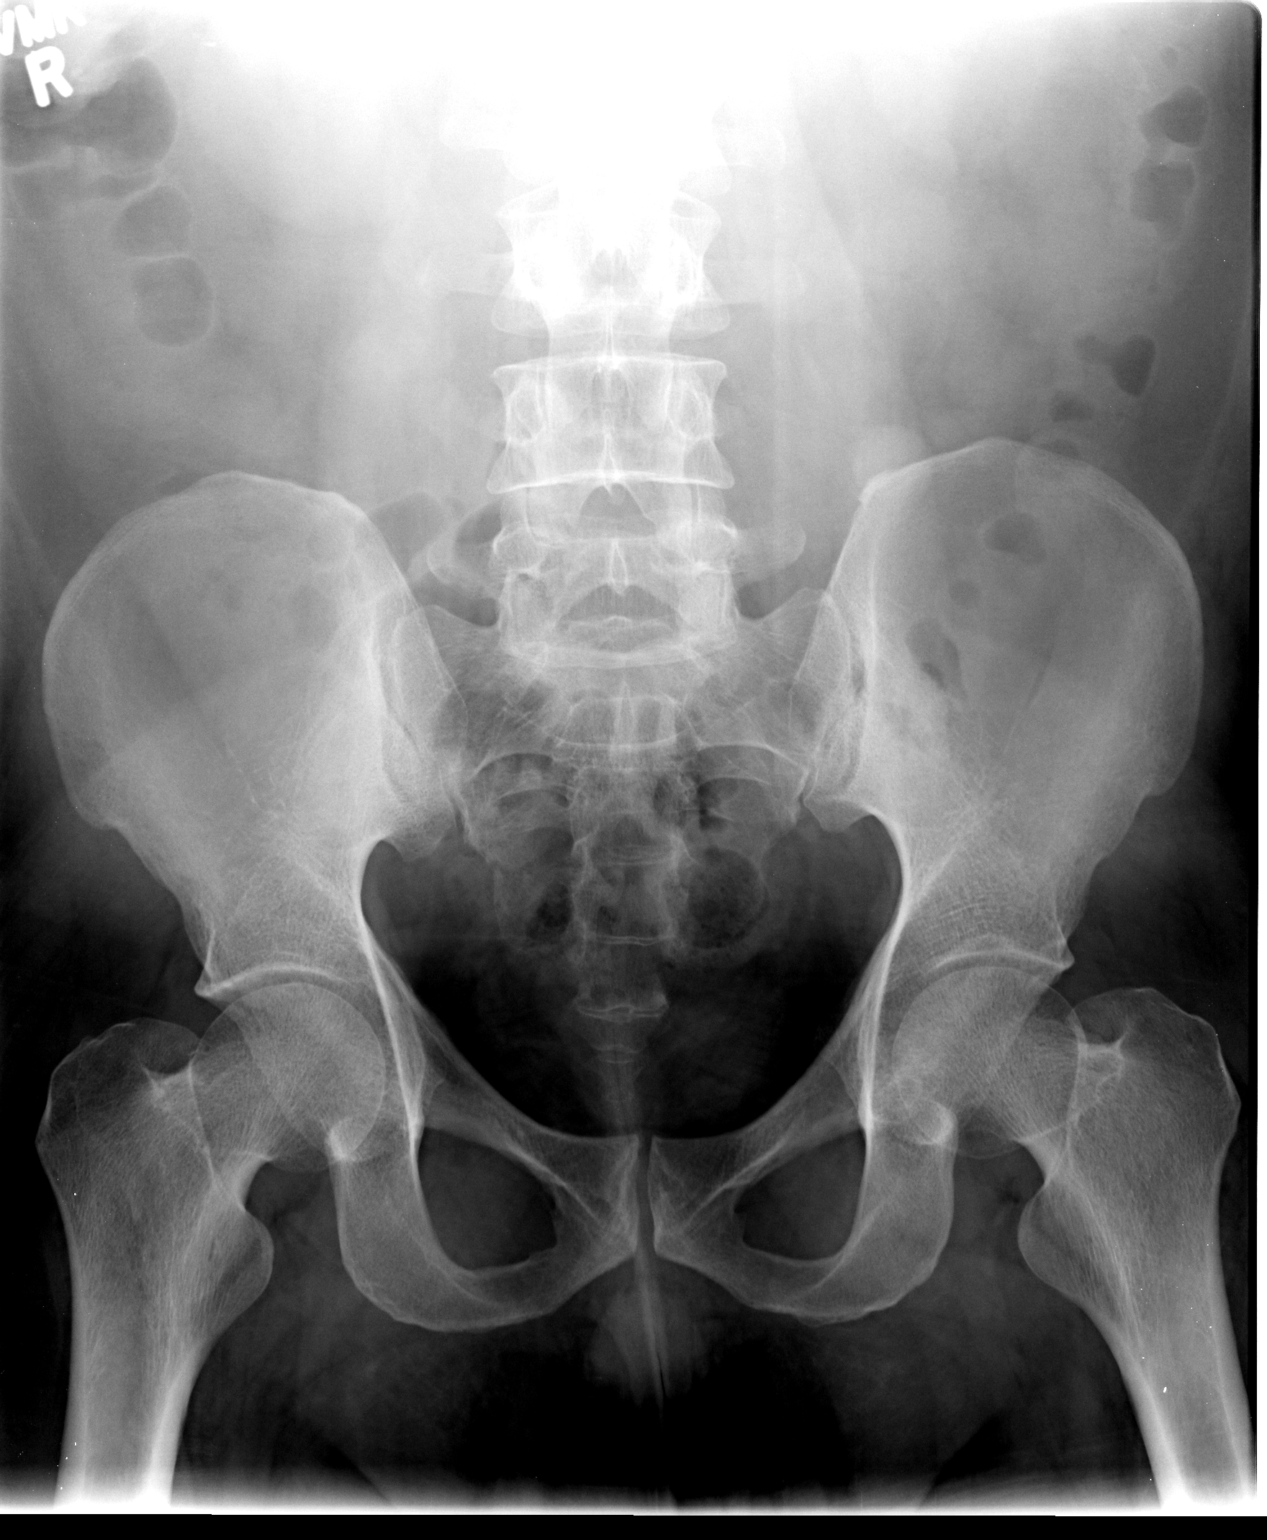

[view not recorded (2 of 2)]
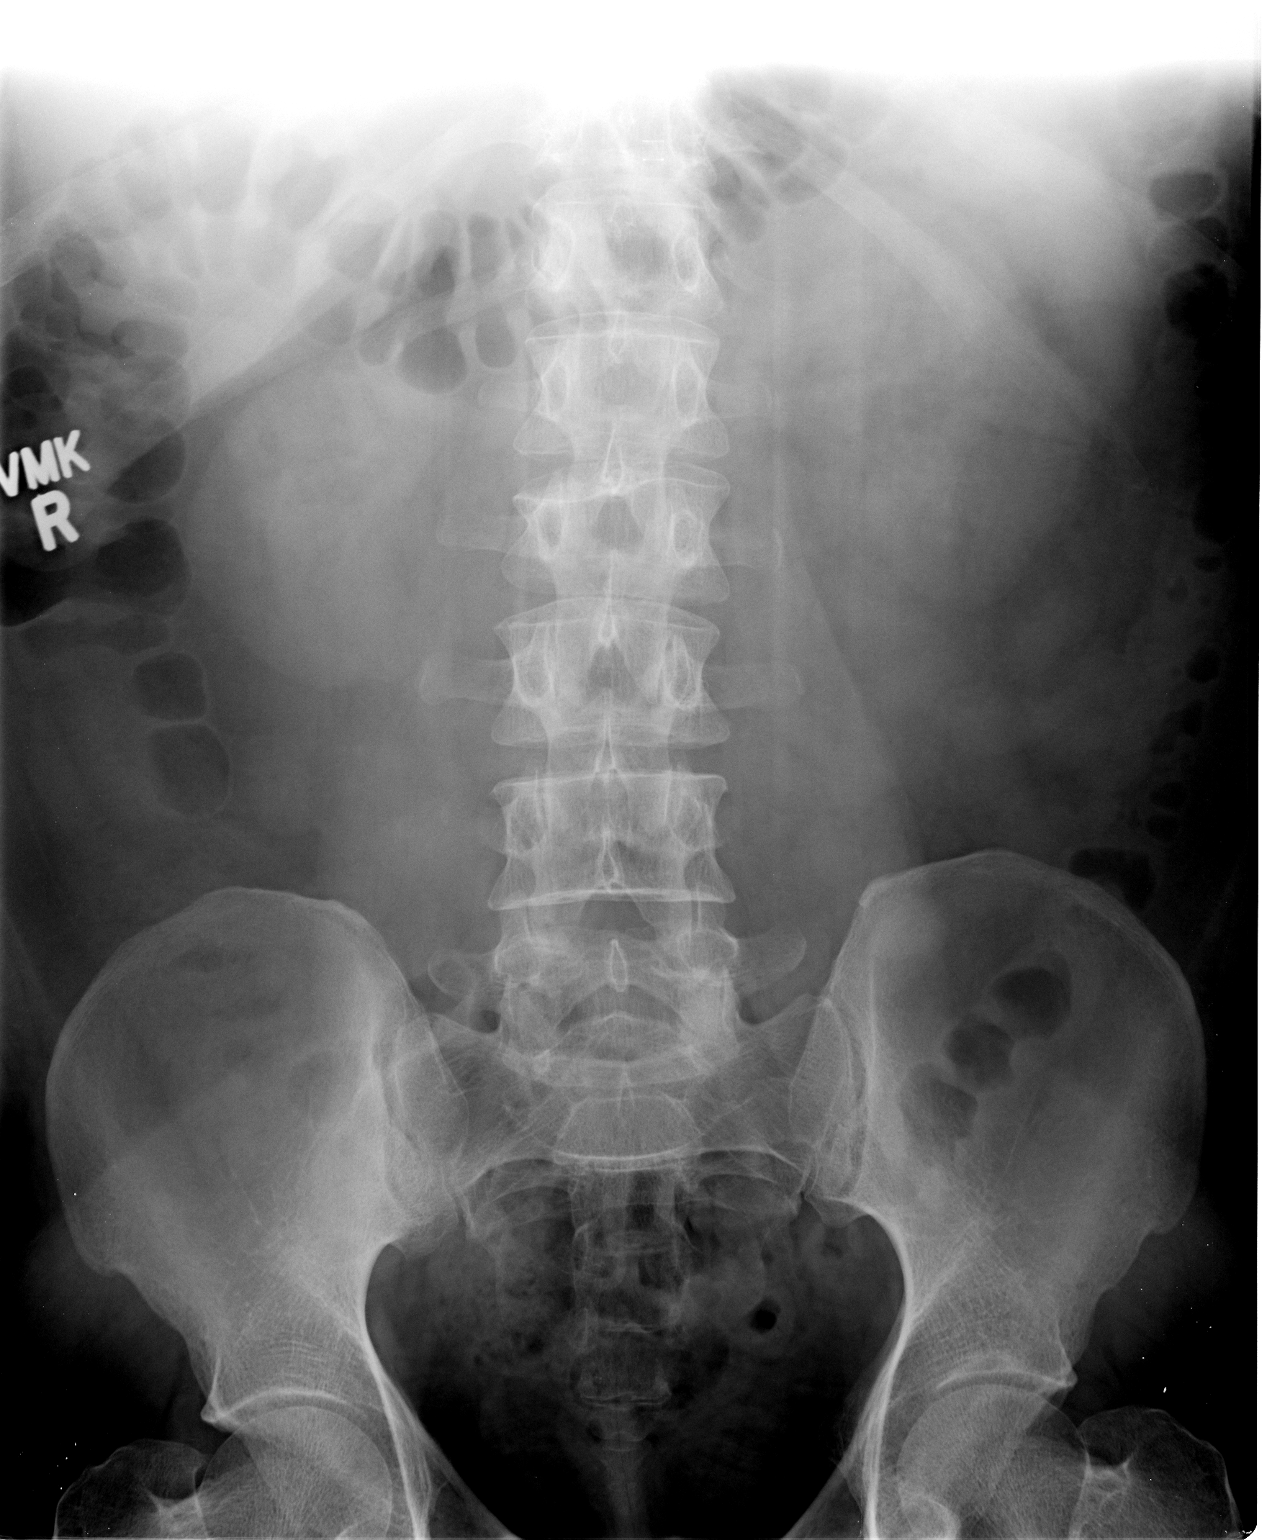

[2 of 2 positions shown; findings below may reference images not displayed]

FINDINGS: Bowel gas pattern is nonobstructive. There is no focal mass or mass
effect. There is a 5 mm sq foreign body over the epigastric region
of uncertain clinical significance. Stable mild L2 compression
deformity.
IMPRESSION: Nonobstructive bowel gas pattern.

5 mm sq foreign body in the epigastric region of uncertain clinical
significance.

Stable L2 mild compression deformity.

## 2014-12-18 ENCOUNTER — Emergency Department (HOSPITAL_BASED_OUTPATIENT_CLINIC_OR_DEPARTMENT_OTHER)
Admission: EM | Admit: 2014-12-18 | Discharge: 2014-12-18 | Disposition: A | Discharge status: Home / Self Care | Payer: Medicare Other | Attending: Emergency Medicine | Admitting: Emergency Medicine

## 2014-12-18 ENCOUNTER — Encounter (HOSPITAL_BASED_OUTPATIENT_CLINIC_OR_DEPARTMENT_OTHER): Payer: Self-pay

## 2014-12-18 DIAGNOSIS — G8929 Other chronic pain: Secondary | ICD-10-CM | POA: Diagnosis not present

## 2014-12-18 DIAGNOSIS — Z880 Allergy status to penicillin: Secondary | ICD-10-CM | POA: Insufficient documentation

## 2014-12-18 DIAGNOSIS — Z79899 Other long term (current) drug therapy: Secondary | ICD-10-CM | POA: Insufficient documentation

## 2014-12-18 DIAGNOSIS — K029 Dental caries, unspecified: Secondary | ICD-10-CM | POA: Diagnosis not present

## 2014-12-18 DIAGNOSIS — K0381 Cracked tooth: Secondary | ICD-10-CM | POA: Diagnosis not present

## 2014-12-18 DIAGNOSIS — Z87828 Personal history of other (healed) physical injury and trauma: Secondary | ICD-10-CM | POA: Insufficient documentation

## 2014-12-18 DIAGNOSIS — K088 Other specified disorders of teeth and supporting structures: Secondary | ICD-10-CM | POA: Diagnosis not present

## 2014-12-18 DIAGNOSIS — K0889 Other specified disorders of teeth and supporting structures: Secondary | ICD-10-CM

## 2014-12-18 MED ORDER — HYDROCODONE-ACETAMINOPHEN 5-325 MG PO TABS
2.0000 | ORAL_TABLET | Freq: Once | ORAL | Status: AC
  Administered 2014-12-18: 2 via ORAL
  Filled 2014-12-18: qty 2

## 2014-12-18 MED ORDER — CLINDAMYCIN HCL 150 MG PO CAPS
300.0000 mg | ORAL_CAPSULE | Freq: Once | ORAL | Status: AC
  Administered 2014-12-18: 300 mg via ORAL
  Filled 2014-12-18: qty 2

## 2014-12-18 MED ORDER — HYDROCODONE-ACETAMINOPHEN 5-325 MG PO TABS: 1.0000 | ORAL_TABLET | ORAL | Status: AC | PRN

## 2014-12-18 MED ORDER — CLINDAMYCIN HCL 300 MG PO CAPS: 300.0000 mg | ORAL_CAPSULE | Freq: Four times a day (QID) | ORAL | Status: AC

## 2014-12-18 MED ORDER — IBUPROFEN 800 MG PO TABS
800.0000 mg | ORAL_TABLET | Freq: Once | ORAL | Status: AC
  Administered 2014-12-18: 800 mg via ORAL
  Filled 2014-12-18: qty 1

## 2014-12-18 MED ORDER — IBUPROFEN 800 MG PO TABS: 800.0000 mg | ORAL_TABLET | Freq: Three times a day (TID) | ORAL | Status: AC | PRN
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
## 2014-12-18 NOTE — Discharge Instructions (Signed)
Dental Pain °A tooth ache may be caused by cavities (tooth decay). Cavities expose the nerve of the tooth to air and hot or cold temperatures. It may come from an infection or abscess (also called a boil or furuncle) around your tooth. It is also often caused by dental caries (tooth decay). This causes the pain you are having. °DIAGNOSIS  °Your caregiver can diagnose this problem by exam. °TREATMENT  °· If caused by an infection, it may be treated with medications which kill germs (antibiotics) and pain medications as prescribed by your caregiver. Take medications as directed. °· Only take over-the-counter or prescription medicines for pain, discomfort, or fever as directed by your caregiver. °· Whether the tooth ache today is caused by infection or dental disease, you should see your dentist as soon as possible for further care. °SEEK MEDICAL CARE IF: °The exam and treatment you received today has been provided on an emergency basis only. This is not a substitute for complete medical or dental care. If your problem worsens or new problems (symptoms) appear, and you are unable to meet with your dentist, call or return to this location. °SEEK IMMEDIATE MEDICAL CARE IF:  °· You have a fever. °· You develop redness and swelling of your face, jaw, or neck. °· You are unable to open your mouth. °· You have severe pain uncontrolled by pain medicine. °MAKE SURE YOU:  °· Understand these instructions. °· Will watch your condition. °· Will get help right away if you are not doing well or get worse. °Document Released: 07/28/2005 Document Revised: 10/20/2011 Document Reviewed: 03/15/2008 °ExitCare® Patient Information ©2015 [employer]. This information is not intended to replace advice given to you by your health care provider. Make sure you discuss any questions you have with your health care provider. ° °Dental Care and Dentist Visits °Dental care supports good overall health. Regular dental visits can also help you  avoid dental pain, bleeding, infection, and other more serious health problems in the future. It is important to keep the mouth healthy because diseases in the teeth, gums, and other oral tissues can spread to other areas of the body. Some problems, such as diabetes, heart disease, and pre-term labor have been associated with poor oral health.  °See your dentist every 6 months. If you experience emergency problems such as a toothache or broken tooth, go to the dentist right away. If you see your dentist regularly, you may catch problems early. It is easier to be treated for problems in the early stages.  °WHAT TO EXPECT AT A DENTIST VISIT  °Your dentist will look for many common oral health problems and recommend proper treatment. At your regular dental visit, you can expect: °· Gentle cleaning of the teeth and gums. This includes scraping and polishing. This helps to remove the sticky substance around the teeth and gums (plaque). Plaque forms in the mouth shortly after eating. Over time, plaque hardens on the teeth as tartar. If tartar is not removed regularly, it can cause problems. Cleaning also helps remove stains. °· Periodic X-rays. These pictures of the teeth and supporting bone will help your dentist assess the health of your teeth. °· Periodic fluoride treatments. Fluoride is a natural mineral shown to help strengthen teeth. Fluoride treatment involves applying a fluoride gel or varnish to the teeth. It is most commonly done in children. °· Examination of the mouth, tongue, jaws, teeth, and gums to look for any oral health problems, such as: °¨ Cavities (dental caries). This is   decay on the tooth caused by plaque, sugar, and acid in the mouth. It is best to catch a cavity when it is small. °¨ Inflammation of the gums caused by plaque buildup (gingivitis). °¨ Problems with the mouth or malformed or misaligned teeth. °¨ Oral cancer or other diseases of the soft tissues or jaws.  °KEEP YOUR TEETH AND GUMS  HEALTHY °For healthy teeth and gums, follow these general guidelines as well as your dentist's specific advice: °· Have your teeth professionally cleaned at the dentist every 6 months. °· Brush twice daily with a fluoride toothpaste. °· Floss your teeth daily.  °· Ask your dentist if you need fluoride supplements, treatments, or fluoride toothpaste. °· Eat a healthy diet. Reduce foods and drinks with added sugar. °· Avoid smoking. °TREATMENT FOR ORAL HEALTH PROBLEMS °If you have oral health problems, treatment varies depending on the conditions present in your teeth and gums. °· Your caregiver will most likely recommend good oral hygiene at each visit. °· For cavities, gingivitis, or other oral health disease, your caregiver will perform a procedure to treat the problem. This is typically done at a separate appointment. Sometimes your caregiver will refer you to another dental specialist for specific tooth problems or for surgery. °SEEK IMMEDIATE DENTAL CARE IF: °· You have pain, bleeding, or soreness in the gum, tooth, jaw, or mouth area. °· A permanent tooth becomes loose or separated from the gum socket. °· You experience a blow or injury to the mouth or jaw area. °Document Released: 04/09/2011 Document Revised: 10/20/2011 Document Reviewed: 04/09/2011 °ExitCare® Patient Information ©2015 [employer]. This information is not intended to replace advice given to you by your health care provider. Make sure you discuss any questions you have with your health care provider. ° ° ° °Emergency Department Resource Guide °1) Find a Doctor and Pay Out of Pocket °Although you won't have to find out who is covered by your insurance plan, it is a good idea to ask around and get recommendations. You will then need to call the office and see if the doctor you have chosen will accept you as a new patient and what types of options they offer for patients who are self-pay. Some doctors offer discounts or will set up payment plans  for their patients who do not have insurance, but you will need to ask so you aren't surprised when you get to your appointment. ° °2) Contact Your Local Health Department °Not all health departments have doctors that can see patients for sick visits, but many do, so it is worth a call to see if yours does. If you don't know where your local health department is, you can check in your phone book. The CDC also has a tool to help you locate your state's health department, and many state websites also have listings of all of their local health departments. ° °3) Find a Walk-in Clinic °If your illness is not likely to be very severe or complicated, you may want to try a walk in clinic. These are popping up all over the country in pharmacies, drugstores, and shopping centers. They're usually staffed by nurse practitioners or physician assistants that have been trained to treat common illnesses and complaints. They're usually fairly quick and inexpensive. However, if you have serious medical issues or chronic medical problems, these are probably not your best option. ° °No Primary Care Doctor: °- Call [facility] at  832-8000 - they can help you locate a primary care doctor that  accepts   your insurance, provides certain services, etc. °- Physician Referral Service- 1-800-533-3463 ° °Chronic Pain Problems: °Organization         Address  Phone   Notes  °[facility]  (336) 297-2271 Patients need to be referred by their primary care doctor.  ° °Medication Assistance: °Organization         Address  Phone   Notes  °[place] [facility] [address] °[place], [place] [address] (336) 641-8030 --Must be a resident of [place] °-- Must have NO insurance coverage whatsoever (no Medicaid/ Medicare, etc.) °-- The pt. MUST have a primary care doctor that directs their care regularly and follows them in the community °  °[facility]  (866) 331-1348   °[employer]  (888)  892-1162   ° °Agencies that provide inexpensive medical care: °Organization         Address  Phone   Notes  °[facility]  (336) 832-8035   °[facility]    (336) 832-7272   °[facility] [address] °[place], [place] [address] (336) 832-4777   °[facility] [address], °[place] (336) 271-4999   °[facility]    (336) 373-0678   °[facility]    (336) 272-1050   °[facility] ° [address], [place] Phone:  (336) 832-4444, Fax:  (336) 832-4440 Hours of Operation:  9 am - 6 pm, M-F.  Also accepts Medicaid/Medicare and self-pay.  °[facility] ° [address], [place] Phone: (336) 832-3150, Fax: (336) 832-3151. Hours of Operation:  8:30 am - 5:30 pm, M-F.  Also accepts Medicaid and self-pay.  °[facility] [place] [address], [place] Phone: (336) 878-6027   °[facility] [address], [place],  (336)723-1848, Ext. 123 Mondays & Thursdays: 7-9 AM.  First 15 patients are seen on a first come, first serve basis. °  ° °Medicaid-accepting [place] Providers: ° °Organization         Address  Phone   Notes  °[facility] [address], [place] (336) 641-2100 Also accepts self-pay patients.  °[facility] [address], [place] ° (336) 856-9996   °[facility] [address], [place] (336) 288-8857   °[facility] [address] [place] [address], [place] (336) 299-7000   °Veita Bland [address], [place]  ° (336) 373-1557 Only accepts [place] Access Medicaid patients after they have their name applied to their card.  ° °Self-Pay (no insurance) in [place]: ° °Organization         Address  Phone   Notes  °Sickle Cell Patients, [facility] [address], [place] (336)  832-1970   °[facility] [address], [place] (336) 832-4400   °[facility] ° [address], [place] (336) 992-4800   °[facility]/Dr. Osei-Bonsu ° [address] [place] [address], [place] or [address], [place] (336) 841-8500 Phone number for both [place] and [place] locations is the same.  °[facility] [address], [place] (336) 299-0000   °[facility] [address] [place] [address], [place] or [address] (336) 852-7530 °(336) 878-2260   °[facility]   [address], [place] (336) 350-1642, phone; (336) 294-5005, fax Sees patients 1st and 3rd Saturday of every month.  Must not qualify for public or private insurance (i.e. Medicaid, Medicare, [place] Health Choice, Veterans' Benefits) • Household income should be no more than 200% of the poverty level •The clinic cannot treat you if you are pregnant or think you are pregnant • Sexually transmitted diseases are not treated at the clinic.  ° ° °Dental Care: °Organization         Address  Phone  Notes  °[facility] [address], [place] (336) 641-6152 Accepts children up to age 21 who are enrolled in Medicaid or [place] Health Choice; pregnant women with a Medicaid card; and children who have applied for Medicaid or [place] Health Choice, but were declined, whose parents can pay a reduced fee at time of service.  °[facility] [place]  [address], [place] (336) 641-7733 Accepts children up to age 21 who are enrolled in Medicaid or [place] Health Choice; pregnant women with a Medicaid card; and children who have applied for Medicaid or [place] Health Choice, but were declined, whose parents can pay a reduced fee at time of service.  °[facility] ° [address], [place] (336) 641-4533 Patients are  seen by appointment only. Walk-ins are not accepted. [facility] will see patients 18 years of age and older. °Monday - Tuesday (8am-5pm) °Most Wednesdays (8:30-5pm) °$30 per visit, cash only  °[facility] ° [address], [place] (336) 641-4533 Patients are seen by appointment only. Walk-ins are not accepted. [facility] will see patients 18 years of age and older. °One Wednesday Evening (Monthly: Volunteer Based).  $30 per visit, cash only  °[facility]  (919) 537-3737 for adults; Children under age 4, call Graduate Pediatric Dentistry at ([phone]) [phone]. Children aged 4-14, please call (919) [phone] to request a pediatric application. ° Dental services are provided in all areas of dental care including fillings, crowns and bridges, complete and partial dentures, implants, gum treatment, root canals, and extractions. Preventive care is also provided. Treatment is provided to both adults and children. °Patients are selected via a lottery and there is often a waiting list. °  °[facility] [address], °[place] ° (336) 763-8833 [email or web address] °  °[facility] [address], [place], [place] (336)723-1848, Ext. 123 Second and Fourth Thursday of each month, opens at 6:30 AM; Clinic ends at 9 AM.  Patients are seen on a first-come first-served basis, and a limited number are seen during each clinic.  ° °[facility] ° [address], [place], [place] (336) 723-7904   Eligibility Requirements °You must have lived in [place], [place], or [place] counties for at least the last three months. °  You cannot be eligible for state or federal sponsored healthcare insurance, including Veterans Administration, Medicaid, or Medicare. °  You generally cannot be eligible for healthcare insurance through your employer.  °  How to apply: °Eligibility screenings are held every Tuesday and Wednesday afternoon from 1:00 pm until 4:00  pm. You do not need an appointment for the interview!  °[facility] [address], [place], [place] 336-631-2330   °[facility]  336-342-8273   °[facility]  336-703-3100   ° [facility]  336-570-6415   ° °  Behavioral Health Resources in the Community: °Intensive Outpatient Programs °Organization         Address  Phone  Notes  °[facility] [address], [place], [place] 336-878-6098   °[facility] [address], [place], [place] 336-832-9800   °[facility] [address], [place], [place] ° 336-882-2125   °[facility] [address],  °[place], [place] 1-800-853-5163 or 336-641-4981   °Substance Abuse Resources °Organization         Address  Phone  Notes  °[facility]  336-882-2125   °[facility]  336-784-9470   °[facility]  336-285-9073   °[facility]  336-845-3988   °Residential & Outpatient Substance Abuse Program  1-800-659-3381   °Psychological Services °Organization         Address  Phone  Notes  °[facility]  336- 832-9600   °[facility]  336- 378-7881   °[facility] [address], [place] 1-800-853-5163 or 336-641-4981   ° °Mobile Crisis Teams °Organization         Address  Phone  Notes  °[facility], Mobile Crisis Care Unit  1-877-626-1772   °Assertive °[facility] ° [address] [place], [place] 336-834-9664   °Sharon DeEsch [address] °[place] 336-554-5454   ° °Self-Help/Support Groups °Organization         Address  Phone             Notes  °[facility] [place] - variety of support groups  336- 373-1402 Call for more information  °Narcotics Anonymous (NA), [facility] [address], °[place]  2 meetings at this location  ° °Residential Treatment Programs °Organization          Address  Phone  Notes  °[facility] [address],    °[place]  1-866-801-8205   °[facility] ° [address], [place], [place] 704-293-8524   °[facility] [address], [place] 336-845-3988 Admissions: 8am-3pm M-F  °[facility] [address]    °[place], [place] 336-841-1104   °[facility] [address], [place], [place] 336-379-7146   °[facility] [address]  °[place], [place] 336-285-9073   °[facility] - Intensive Outpatient [address], [place], [place] 336-852-3033   °[facility] [address]  °[place], [place] 1-877-615-2722 or 336-784-9470   °[facility] [address] [place], [place] 336-227-7417 Accepts Medicaid  °[facility] [address]  ° [place] 1-800-659-3381 Substance Abuse/Addiction Treatment  ° °[place] Behavioral Health Resources °Organization         Address  Phone  Notes  °[facility]  (888) 581-9988   °Julie Brannon, PhD [address] [place], [place]   (336) 349-5553 or (336) 951-0000   °[facility]   [address] °[place], [place] (336) 349-4454   °[facility] [address], [place], [place] (336) 342-8316 Insurance/Medicaid/sponsorship through [facility]  °[facility] [address]                                    [place],  (336) 342-8316 Therapy/tele-psych/case  °[facility] [address]  ° [place],   [place] (336) 349-2233    °Dr. Arfeen  (336) 349-4544   °[facility] [place]  [employer] [place] [facility] 1) [address], [place] °2) [address], [place] °3)  [address], [place] (336) 349-3220 °(336) 342-7768 ° °(336) 342-8140   °[place] [facility] (336) 342-1394 or (336) 342-3537 (After Hours)    ° ° ° °

## 2014-12-18 NOTE — ED Notes (Signed)
Right upper dental pain with facial swelling. Reports broken tooth x "years." Pt does not have a dentist.
